# Patient Record
Sex: Female | Born: 1990 | Race: White | Hispanic: No | Marital: Married | Smoking: Never smoker
Health system: Southern US, Community
[De-identification: ages and names within clinical notes are randomized; demographics above are authoritative.]

---

## 2020-10-24 ENCOUNTER — Emergency Department (INDEPENDENT_AMBULATORY_CARE_PROVIDER_SITE_OTHER): Payer: BC Managed Care – PPO

## 2020-10-24 ENCOUNTER — Emergency Department (INDEPENDENT_AMBULATORY_CARE_PROVIDER_SITE_OTHER)
Admission: EM | Admit: 2020-10-24 | Discharge: 2020-10-24 | Disposition: A | Discharge status: Home / Self Care | Payer: Self-pay | Source: Home / Self Care

## 2020-10-24 ENCOUNTER — Other Ambulatory Visit: Payer: Self-pay

## 2020-10-24 DIAGNOSIS — M25532 Pain in left wrist: Secondary | ICD-10-CM

## 2020-10-24 DIAGNOSIS — R42 Dizziness and giddiness: Secondary | ICD-10-CM
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
